# Patient Record
Sex: Female | Born: 1960 | Race: White | Hispanic: No | Marital: Married | Smoking: Never smoker
Health system: Southern US, Community
[De-identification: ages and names within clinical notes are randomized; demographics above are authoritative.]

---

## 2016-11-03 ENCOUNTER — Encounter (HOSPITAL_COMMUNITY): Payer: Self-pay | Admitting: Emergency Medicine

## 2016-11-03 ENCOUNTER — Emergency Department (HOSPITAL_COMMUNITY)
Admission: EM | Admit: 2016-11-03 | Discharge: 2016-11-03 | Disposition: A | Discharge status: Home / Self Care | Payer: Managed Care, Other (non HMO) | Attending: Emergency Medicine | Admitting: Emergency Medicine

## 2016-11-03 DIAGNOSIS — R1084 Generalized abdominal pain: Secondary | ICD-10-CM | POA: Diagnosis present

## 2016-11-03 DIAGNOSIS — B029 Zoster without complications: Secondary | ICD-10-CM | POA: Diagnosis not present

## 2016-11-03 LAB — CBC
HEMATOCRIT: 35.9 % — AB (ref 36.0–46.0)
HEMOGLOBIN: 11.7 g/dL — AB (ref 12.0–15.0)
MCH: 28.3 pg (ref 26.0–34.0)
MCHC: 32.6 g/dL (ref 30.0–36.0)
MCV: 86.9 fL (ref 78.0–100.0)
Platelets: 282 10*3/uL (ref 150–400)
RBC: 4.13 MIL/uL (ref 3.87–5.11)
RDW: 12.7 % (ref 11.5–15.5)
WBC: 4.5 10*3/uL (ref 4.0–10.5)

## 2016-11-03 LAB — URINALYSIS, ROUTINE W REFLEX MICROSCOPIC
Bacteria, UA: NONE SEEN
Bilirubin Urine: NEGATIVE
GLUCOSE, UA: NEGATIVE mg/dL
Hgb urine dipstick: NEGATIVE
KETONES UR: 5 mg/dL — AB
Nitrite: NEGATIVE
PH: 6 (ref 5.0–8.0)
Protein, ur: NEGATIVE mg/dL
SPECIFIC GRAVITY, URINE: 1.018 (ref 1.005–1.030)
Squamous Epithelial / LPF: NONE SEEN

## 2016-11-03 LAB — BASIC METABOLIC PANEL
ANION GAP: 10 (ref 5–15)
BUN: 7 mg/dL (ref 6–20)
CHLORIDE: 106 mmol/L (ref 101–111)
CO2: 22 mmol/L (ref 22–32)
Calcium: 9.6 mg/dL (ref 8.9–10.3)
Creatinine, Ser: 0.71 mg/dL (ref 0.44–1.00)
GFR calc Af Amer: >60 mL/min (ref >60)
GFR calc non Af Amer: >60 mL/min (ref >60)
GLUCOSE: 118 mg/dL — AB (ref 65–99)
POTASSIUM: 3.8 mmol/L (ref 3.5–5.1)
Sodium: 138 mmol/L (ref 135–145)

## 2016-11-03 MED ORDER — VALACYCLOVIR HCL 1 G PO TABS: 1000.0000 mg | ORAL_TABLET | Freq: Three times a day (TID) | ORAL | 0 refills | Status: AC

## 2016-11-03 MED ORDER — MORPHINE SULFATE 15 MG PO TABS: 15.0000 mg | ORAL_TABLET | ORAL | 0 refills | Status: AC | PRN

## 2016-11-03 MED ORDER — OXYCODONE-ACETAMINOPHEN 5-325 MG PO TABS
1.0000 | ORAL_TABLET | ORAL | Status: DC | PRN
  Administered 2016-11-03: 1 via ORAL

## 2016-11-03 MED ORDER — OXYCODONE-ACETAMINOPHEN 5-325 MG PO TABS
ORAL_TABLET | ORAL | Status: AC
  Filled 2016-11-03: qty 1

## 2016-11-03 NOTE — Discharge Instructions (Signed)

## 2016-11-03 NOTE — ED Triage Notes (Signed)
Pt c/o left side sharp flank pain for the past few day, no burning sensation or urinary symptom, pain getting worse today.

## 2016-11-03 NOTE — ED Provider Notes (Signed)
[facility]-EMERGENCY DEPT Provider Note   CSN: [id number] Arrival date & time: 11/03/16  0504     History   Chief Complaint Chief Complaint  Patient presents with  . Flank Pain    HPI Belinda George is a 56 y.o. female.  56 yo F with a chief complaint of left-sided low back pain. Described as sharp shooting and burning radiates to the anterior aspect of her abdomen. Going on since yesterday. Denies fevers or chills denies nausea or vomiting. Denies dysuria hematuria or increased frequency. No history of kidney stones in the past. Nothing seems to make this better or worse. As she was sitting in the waiting room she noticed that a rash had appeared.   The history is provided by the patient and the spouse.  Illness  This is a new problem. The current episode started yesterday. The problem occurs constantly. The problem has been gradually worsening. Pertinent negatives include no chest pain, no headaches and no shortness of breath. Nothing aggravates the symptoms. Nothing relieves the symptoms. She has tried nothing for the symptoms. The treatment provided no relief.    History reviewed. No pertinent past medical history.  There are no active problems to display for this patient.   History reviewed. No pertinent surgical history.  OB History    No data available       Home Medications    Prior to Admission medications   Medication Sig Start Date End Date Taking? Authorizing Provider  ibuprofen (ADVIL,MOTRIN) 200 MG tablet Take 200-400 mg by mouth every 6 (six) hours as needed.   Yes [provider]  Multiple Vitamin (MULTIVITAMIN WITH MINERALS) TABS tablet Take 1 tablet by mouth daily.   Yes [provider]  morphine (MSIR) 15 MG tablet Take 1 tablet (15 mg total) by mouth every 4 (four) hours as needed for severe pain. 11/03/16   Melene Plan, DO  valACYclovir (VALTREX) 1000 MG tablet Take 1 tablet (1,000 mg total) by mouth 3 (three) times daily. 11/03/16 11/10/16   Melene Plan, DO    Family History History reviewed. No pertinent family history.  Social History Social History  Substance Use Topics  . Smoking status: Never Smoker  . Smokeless tobacco: Never Used  . Alcohol use No     Allergies   Strawberry (diagnostic)   Review of Systems Review of Systems  Constitutional: Negative for chills and fever.  HENT: Negative for congestion and rhinorrhea.   Eyes: Negative for redness and visual disturbance.  Respiratory: Negative for shortness of breath and wheezing.   Cardiovascular: Negative for chest pain and palpitations.  Gastrointestinal: Negative for nausea and vomiting.  Genitourinary: Negative for dysuria and urgency.  Musculoskeletal: Positive for back pain. Negative for arthralgias and myalgias.  Skin: Negative for pallor and wound.  Neurological: Negative for dizziness and headaches.     Physical Exam Updated Vital Signs BP 100/82 (BP Location: Right Arm)   Pulse 65   Temp 98 F (36.7 C) (Oral)   Resp 19   Ht 5\' 9"  (1.753 m)   Wt 63.5 kg (140 lb)   SpO2 99%   BMI 20.67 kg/m   Physical Exam  Constitutional: She is oriented to person, place, and time. She appears well-developed and well-nourished. No distress.  HENT:  Head: Normocephalic and atraumatic.  Eyes: Pupils are equal, round, and reactive to light. EOM are normal.  Neck: Normal range of motion. Neck supple.  Cardiovascular: Normal rate and regular rhythm.  Exam reveals no gallop and  no friction rub.   No murmur heard. Pulmonary/Chest: Effort normal. She has no wheezes. She has no rales.  Abdominal: Soft. She exhibits no distension and no mass. There is no tenderness. There is no guarding.  Musculoskeletal: She exhibits no edema or tenderness.  Neurological: She is alert and oriented to person, place, and time.  Skin: Skin is warm and dry. Rash noted. She is not diaphoretic.  Vesicular rash along the single dermatome to the left side of her low back.    Psychiatric: She has a normal mood and affect. Her behavior is normal.  Nursing note and vitals reviewed.    ED Treatments / Results  Labs (all labs ordered are listed, but only abnormal results are displayed) Labs Reviewed  URINALYSIS, ROUTINE W REFLEX MICROSCOPIC - Abnormal; Notable for the following:       Result Value   Ketones, ur 5 (*)    Leukocytes, UA SMALL (*)    All other components within normal limits  BASIC METABOLIC PANEL - Abnormal; Notable for the following:    Glucose, Bld 118 (*)    All other components within normal limits  CBC - Abnormal; Notable for the following:    Hemoglobin 11.7 (*)    HCT 35.9 (*)    All other components within normal limits    EKG  EKG Interpretation None       Radiology No results found.  Procedures Procedures (including critical care time)  Medications Ordered in ED Medications  oxyCODONE-acetaminophen (PERCOCET/ROXICET) 5-325 MG per tablet 1 tablet (1 tablet Oral Given 11/03/16 0545)  oxyCODONE-acetaminophen (PERCOCET/ROXICET) 5-325 MG per tablet (not administered)     Initial Impression / Assessment and Plan / ED Course  I have reviewed the triage vital signs and the nursing notes.  Pertinent labs & imaging results that were available during my care of the patient were reviewed by me and considered in my medical decision making (see chart for details).     56 yo F Clinically with shingles. Will treat. PCP follow-up.   10:23 AM:  I have discussed the diagnosis/risks/treatment options with the patient and family and believe the pt to be eligible for discharge home to follow-up with PCP. We also discussed returning to the ED immediately if new or worsening sx occur. We discussed the sx which are most concerning (e.g., sudden worsening pain, fever, inability to tolerate by mouth) that necessitate immediate return. Medications administered to the patient during their visit and any new prescriptions provided to the  patient are listed below.  Medications given during this visit Medications  oxyCODONE-acetaminophen (PERCOCET/ROXICET) 5-325 MG per tablet 1 tablet (1 tablet Oral Given 11/03/16 0545)  oxyCODONE-acetaminophen (PERCOCET/ROXICET) 5-325 MG per tablet (not administered)     The patient appears reasonably screen and/or stabilized for discharge and I doubt any other medical condition or other [facility] requiring further screening, evaluation, or treatment in the ED at this time prior to discharge.   Final Clinical Impressions(s) / ED Diagnoses   Final diagnoses:  Herpes zoster without complication    New Prescriptions New Prescriptions   MORPHINE (MSIR) 15 MG TABLET    Take 1 tablet (15 mg total) by mouth every 4 (four) hours as needed for severe pain.   VALACYCLOVIR (VALTREX) 1000 MG TABLET    Take 1 tablet (1,000 mg total) by mouth 3 (three) times daily.     Melene Plan, DO 11/03/16 1023

## 2019-02-10 ENCOUNTER — Other Ambulatory Visit: Payer: Self-pay | Admitting: Obstetrics & Gynecology

## 2019-02-10 DIAGNOSIS — R928 Other abnormal and inconclusive findings on diagnostic imaging of breast: Secondary | ICD-10-CM

## 2019-02-17 ENCOUNTER — Ambulatory Visit
Admission: RE | Admit: 2019-02-17 | Discharge: 2019-02-17 | Disposition: A | Discharge status: Home / Self Care | Payer: BC Managed Care – PPO | Source: Ambulatory Visit | Attending: Obstetrics & Gynecology | Admitting: Obstetrics & Gynecology

## 2019-02-17 ENCOUNTER — Other Ambulatory Visit: Payer: Self-pay

## 2019-02-17 ENCOUNTER — Ambulatory Visit
Admission: RE | Admit: 2019-02-17 | Discharge: 2019-02-17 | Disposition: A | Discharge status: Home / Self Care | Payer: Managed Care, Other (non HMO) | Source: Ambulatory Visit | Attending: Obstetrics & Gynecology | Admitting: Obstetrics & Gynecology

## 2019-02-17 DIAGNOSIS — R928 Other abnormal and inconclusive findings on diagnostic imaging of breast: Secondary | ICD-10-CM

## 2020-10-23 IMAGING — MG MM DIGITAL DIAGNOSTIC UNILAT*R* W/ TOMO W/ CAD
4 series · 4 of 12 positions shown · non-contrast
Comparison: Previous exam(s).

CLINICAL DATA: Screening recall for a possible architectural
distortion in the right breast.

EXAM:
DIGITAL DIAGNOSTIC RIGHT MAMMOGRAM WITH CAD AND TOMO
ULTRASOUND RIGHT BREAST

[R ML synth-2D]
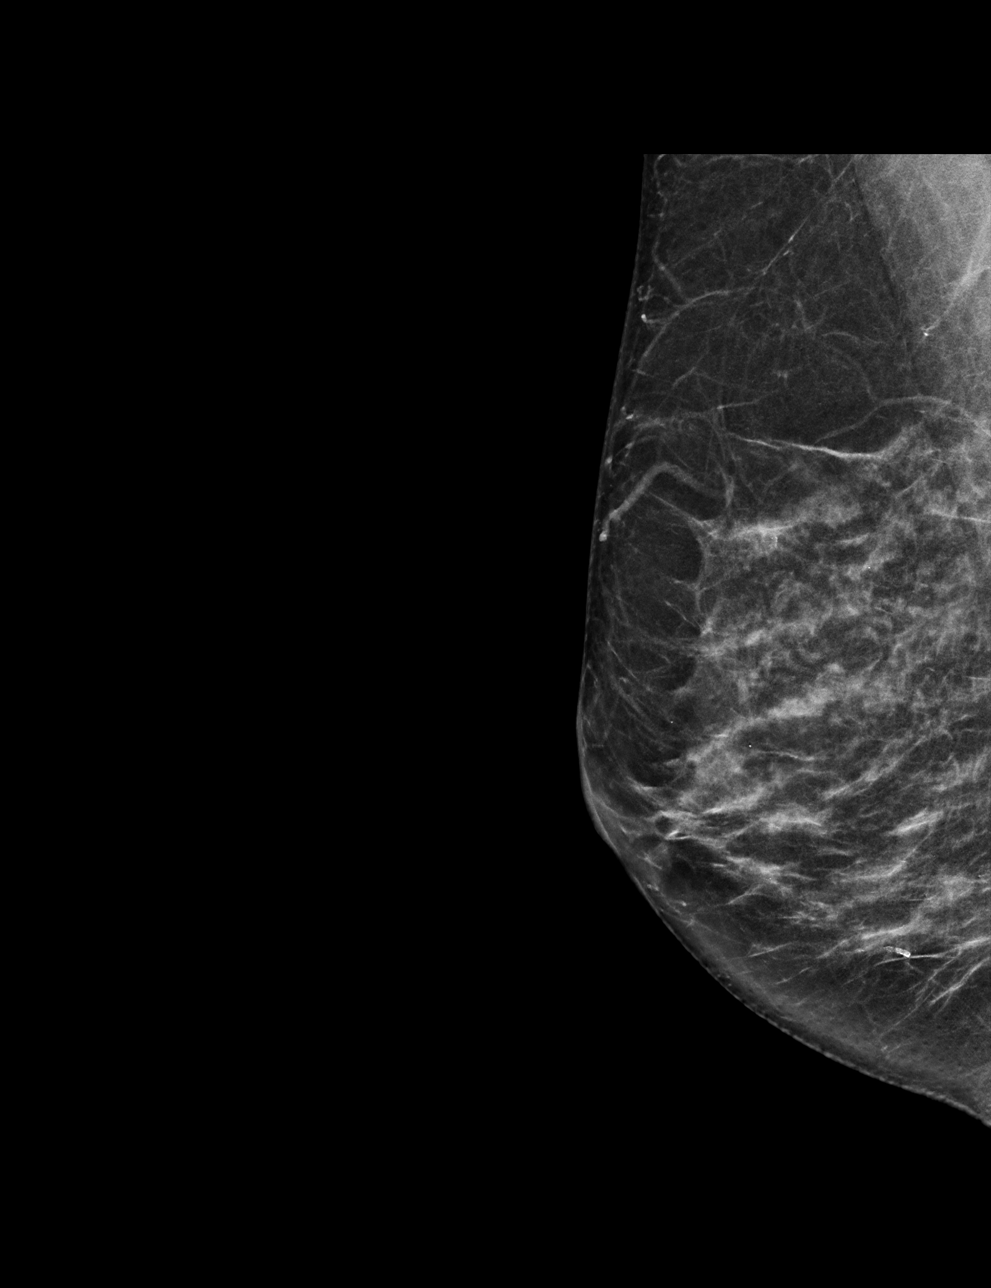

[R CC synth-2D]
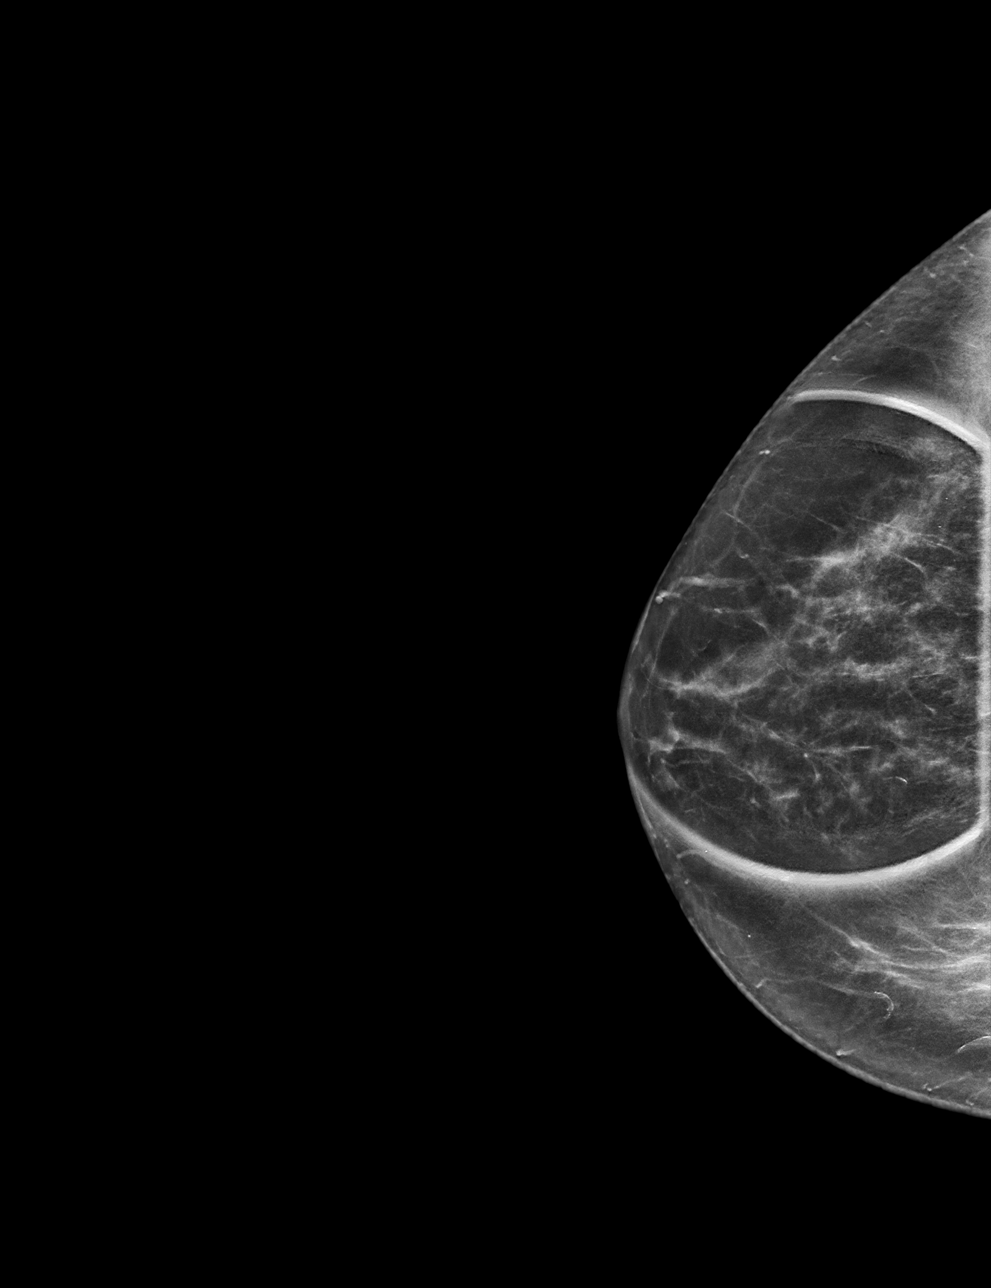

[R CC tomo · tomo slice 29/58.0]
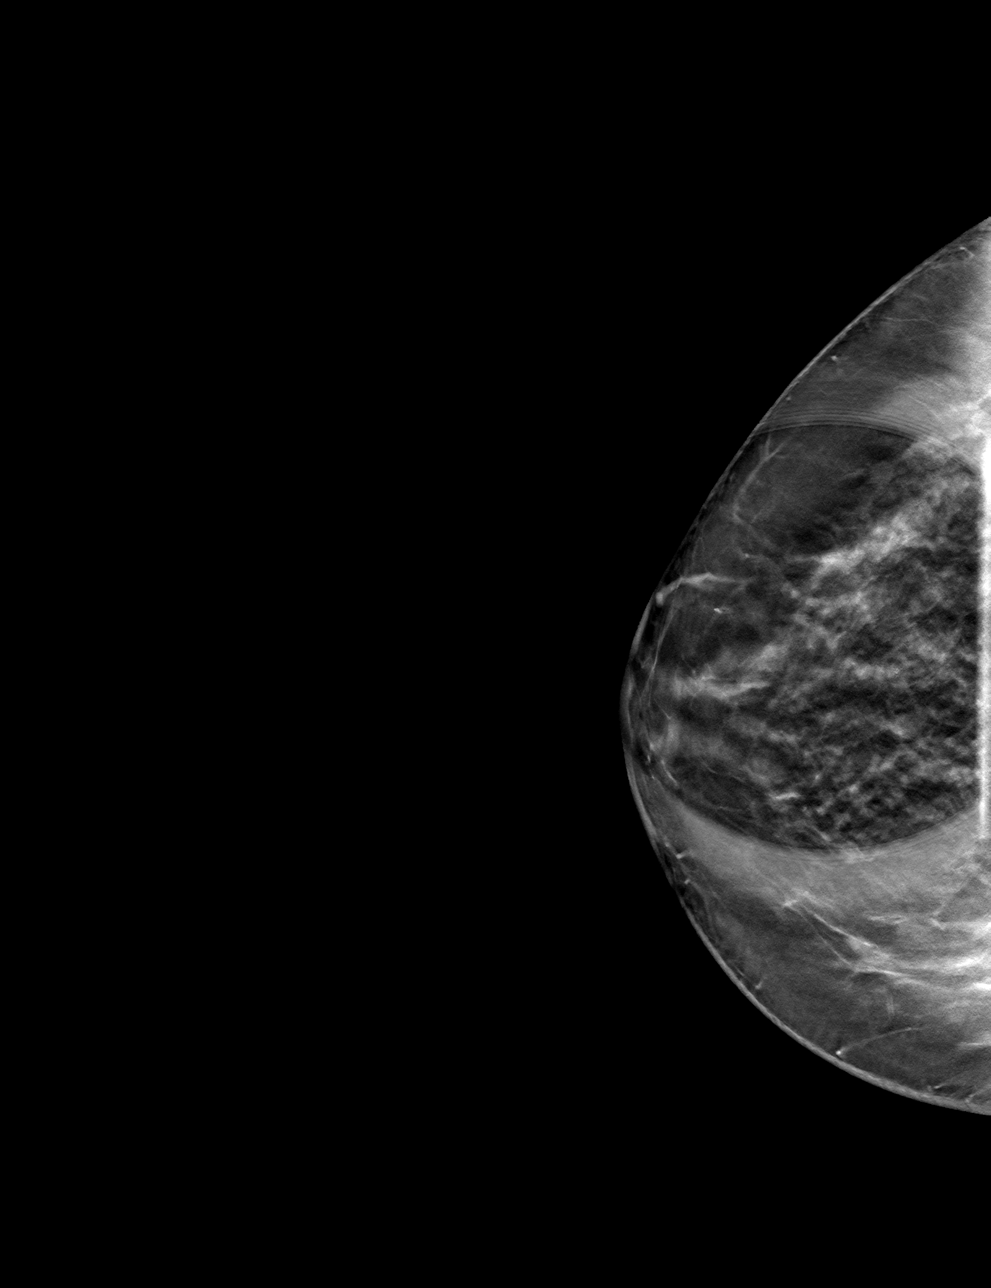

[R ML tomo · tomo slice 31/61.0]
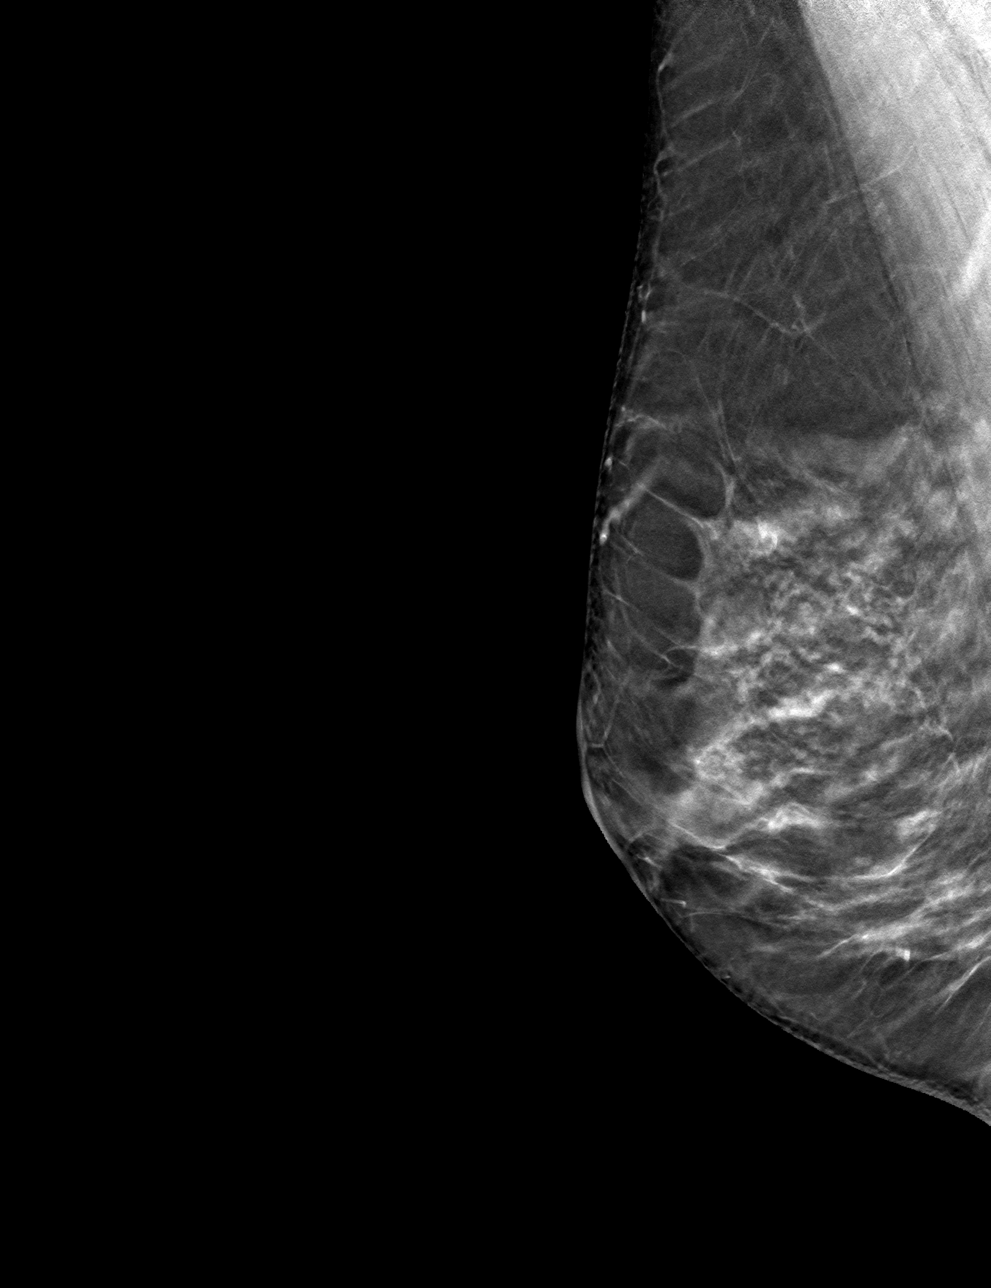

[4 of 12 positions shown; findings below may reference images not displayed]

ACR Breast Density Category c: The breast tissue is heterogeneously
dense, which may obscure small masses.
FINDINGS: On spot compression imaging, the possible architectural distortion,
which was noted on the screening CC view, disperses consistent with
superimposed normal fibroglandular tissue. There is no true residual
architectural distortion. There is no underlying mass or significant
asymmetry. There are no suspicious calcifications.

Mammographic images were processed with CAD.

On physical exam, no mass is palpated in the lateral right breast.

Targeted ultrasound is performed, showing normal fibroglandular
tissue throughout the lateral right breast. No mass or suspicious
lesion.
IMPRESSION: 1. Negative exam.  No evidence of breast malignancy.

RECOMMENDATION:
Screening mammogram in one year.(Code:OI-S-LX6)

I have discussed the findings and recommendations with the patient.
If applicable, a reminder letter will be sent to the patient
regarding the next appointment.

BI-RADS CATEGORY  1: Negative.

## 2020-10-23 IMAGING — US US BREAST*R* LIMITED INC AXILLA
1 series · 7 of 7 positions shown · non-contrast
Comparison: Previous exam(s).

CLINICAL DATA: Screening recall for a possible architectural
distortion in the right breast.

EXAM:
DIGITAL DIAGNOSTIC RIGHT MAMMOGRAM WITH CAD AND TOMO
ULTRASOUND RIGHT BREAST

[Series 1: us breast*right* limited inc axilla · 0.07mm/px · 7 of 7 slices shown]
[im 1/7]
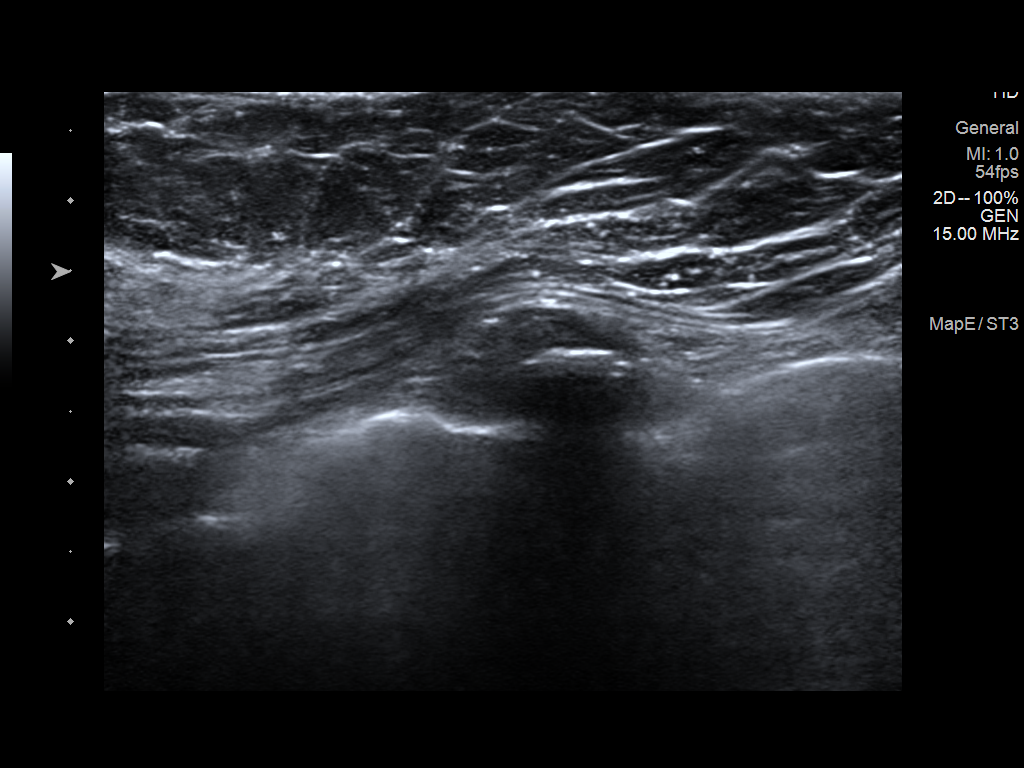
[im 2/7]
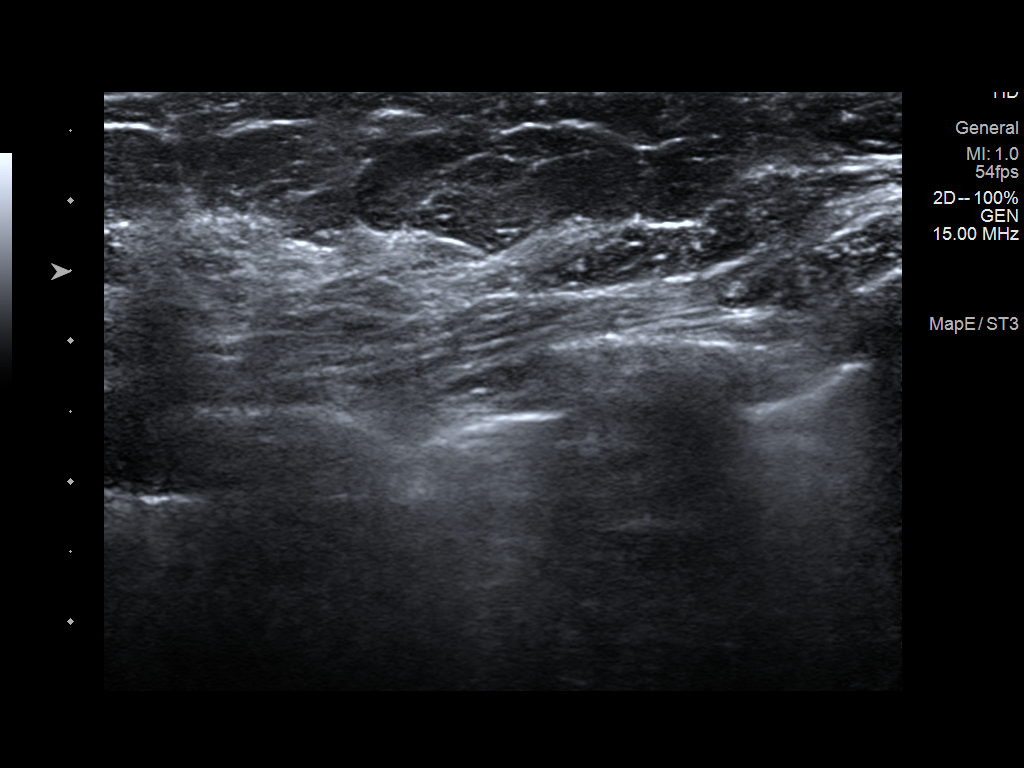
[im 3/7]
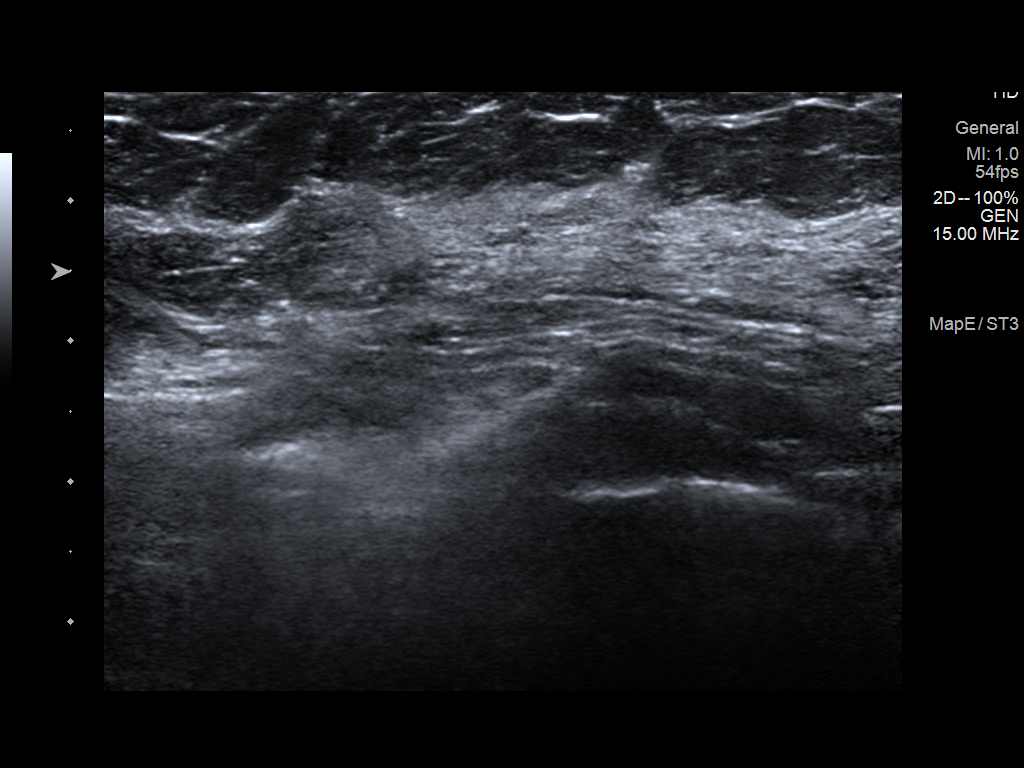
[im 4/7]
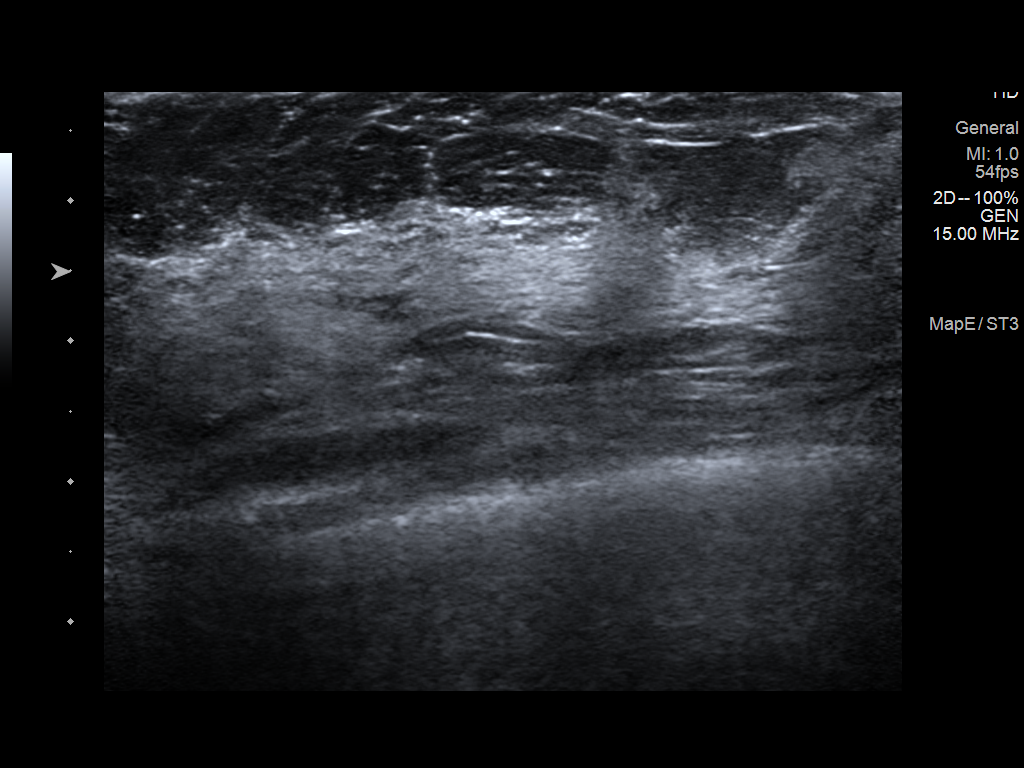
[im 5/7]
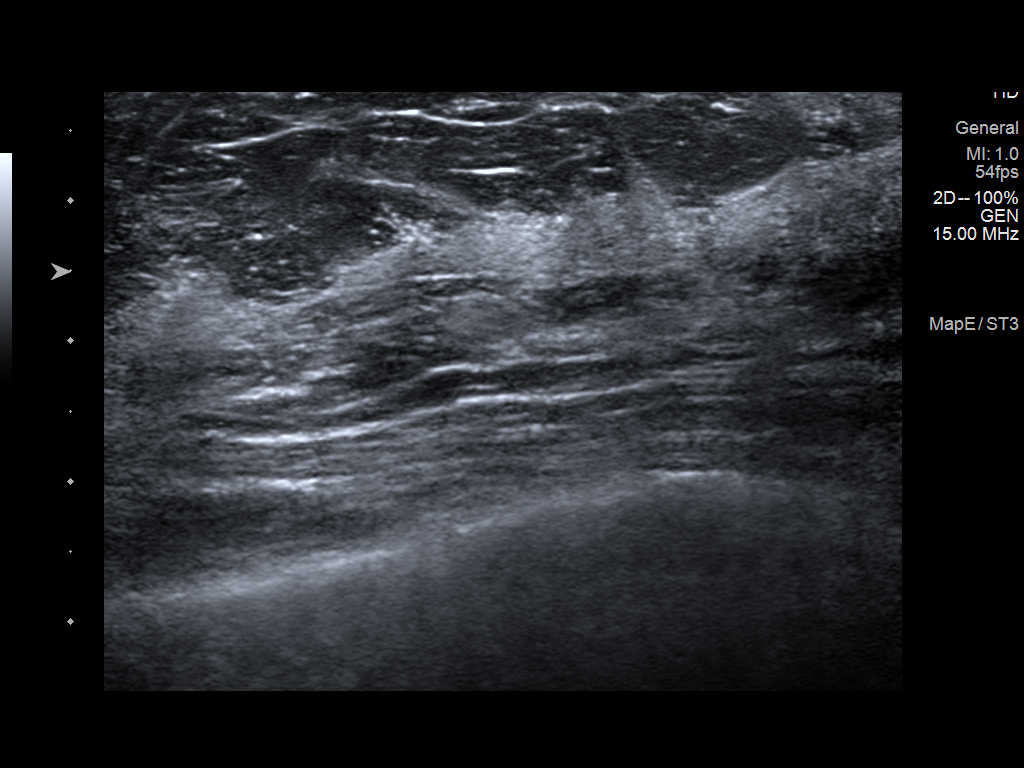
[im 6/7]
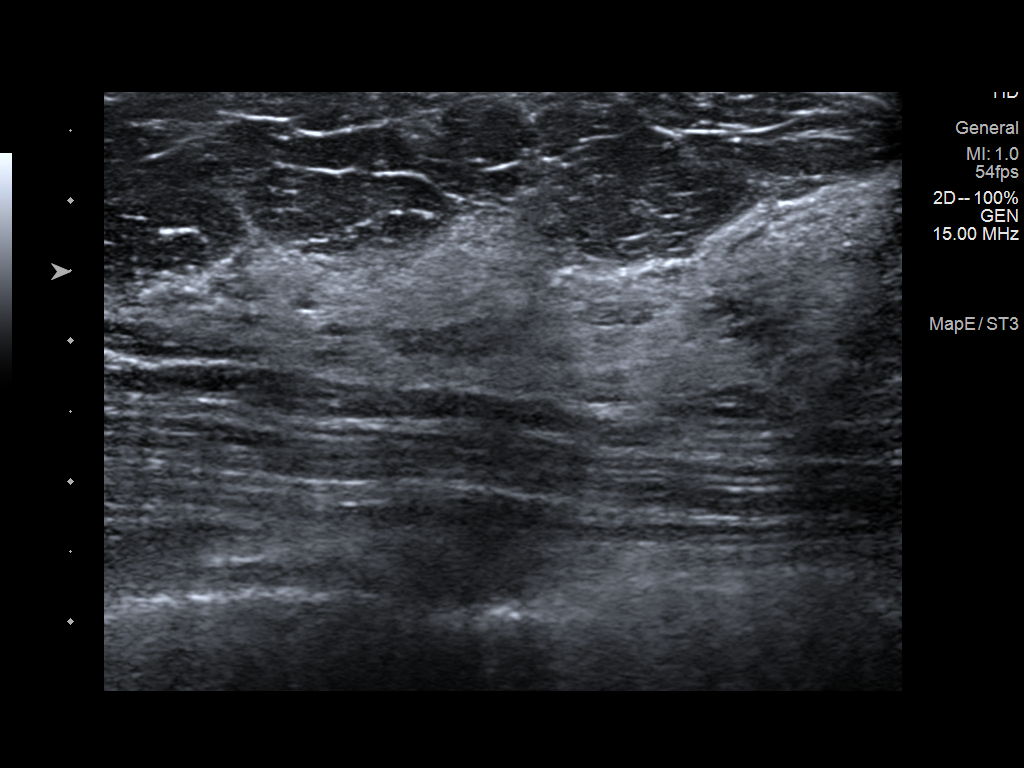
[im 7/7]
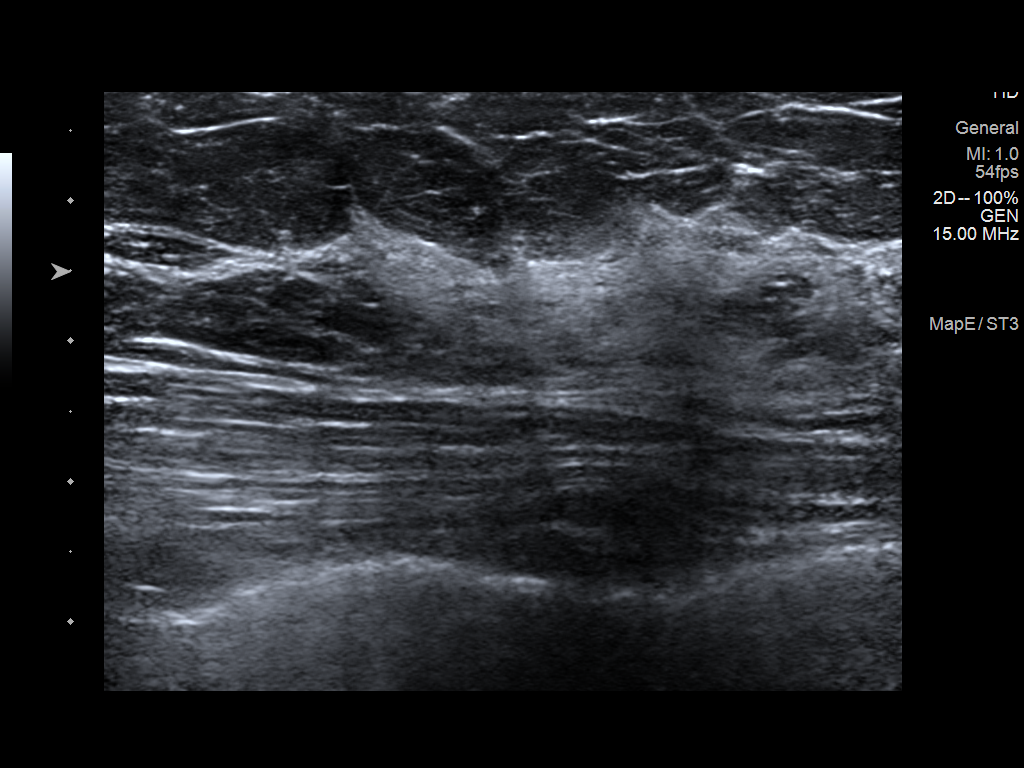

[7 of 7 positions shown; findings below may reference images not displayed]

ACR Breast Density Category c: The breast tissue is heterogeneously
dense, which may obscure small masses.
FINDINGS: On spot compression imaging, the possible architectural distortion,
which was noted on the screening CC view, disperses consistent with
superimposed normal fibroglandular tissue. There is no true residual
architectural distortion. There is no underlying mass or significant
asymmetry. There are no suspicious calcifications.

Mammographic images were processed with CAD.

On physical exam, no mass is palpated in the lateral right breast.

Targeted ultrasound is performed, showing normal fibroglandular
tissue throughout the lateral right breast. No mass or suspicious
lesion.
IMPRESSION: 1. Negative exam.  No evidence of breast malignancy.

RECOMMENDATION:
Screening mammogram in one year.(Code:OI-S-LX6)

I have discussed the findings and recommendations with the patient.
If applicable, a reminder letter will be sent to the patient
regarding the next appointment.

BI-RADS CATEGORY  1: Negative.
